# Patient Record
Sex: Female | Born: 1950 | Race: White | Hispanic: No | Marital: Married
Health system: Southern US, Community
[De-identification: ages and names within clinical notes are randomized; demographics above are authoritative.]

## PROBLEM LIST (undated history)

## (undated) HISTORY — PX: OTHER SURGICAL HISTORY: SHX169

---

## 2017-03-12 DIAGNOSIS — H5203 Hypermetropia, bilateral: Secondary | ICD-10-CM | POA: Diagnosis not present

## 2017-03-12 DIAGNOSIS — H52223 Regular astigmatism, bilateral: Secondary | ICD-10-CM | POA: Diagnosis not present

## 2017-03-12 DIAGNOSIS — H2513 Age-related nuclear cataract, bilateral: Secondary | ICD-10-CM | POA: Diagnosis not present

## 2017-03-12 DIAGNOSIS — H524 Presbyopia: Secondary | ICD-10-CM | POA: Diagnosis not present

## 2018-04-23 ENCOUNTER — Other Ambulatory Visit: Payer: Self-pay

## 2018-04-23 ENCOUNTER — Emergency Department (HOSPITAL_BASED_OUTPATIENT_CLINIC_OR_DEPARTMENT_OTHER)
Admission: EM | Admit: 2018-04-23 | Discharge: 2018-04-23 | Disposition: A | Discharge status: Home / Self Care | Payer: PPO | Attending: Emergency Medicine | Admitting: Emergency Medicine

## 2018-04-23 ENCOUNTER — Encounter (HOSPITAL_BASED_OUTPATIENT_CLINIC_OR_DEPARTMENT_OTHER): Payer: Self-pay | Admitting: Emergency Medicine

## 2018-04-23 ENCOUNTER — Emergency Department (HOSPITAL_BASED_OUTPATIENT_CLINIC_OR_DEPARTMENT_OTHER): Payer: PPO

## 2018-04-23 DIAGNOSIS — M545 Low back pain, unspecified: Secondary | ICD-10-CM

## 2018-04-23 DIAGNOSIS — Z87891 Personal history of nicotine dependence: Secondary | ICD-10-CM | POA: Insufficient documentation

## 2018-04-23 DIAGNOSIS — R1032 Left lower quadrant pain: Secondary | ICD-10-CM | POA: Diagnosis present

## 2018-04-23 LAB — COMPREHENSIVE METABOLIC PANEL
ALT: 18 U/L (ref 0–44)
AST: 17 U/L (ref 15–41)
Albumin: 4.4 g/dL (ref 3.5–5.0)
Alkaline Phosphatase: 69 U/L (ref 38–126)
Anion gap: 6 (ref 5–15)
BUN: 20 mg/dL (ref 8–23)
CO2: 25 mmol/L (ref 22–32)
Calcium: 9.3 mg/dL (ref 8.9–10.3)
Chloride: 108 mmol/L (ref 98–111)
Creatinine, Ser: 0.83 mg/dL (ref 0.44–1.00)
GFR calc non Af Amer: >60 mL/min (ref >60)
Glucose, Bld: 103 mg/dL — ABNORMAL HIGH (ref 70–99)
Potassium: 3.7 mmol/L (ref 3.5–5.1)
Sodium: 139 mmol/L (ref 135–145)
Total Bilirubin: 0.4 mg/dL (ref 0.3–1.2)
Total Protein: 7.3 g/dL (ref 6.5–8.1)

## 2018-04-23 LAB — URINALYSIS, MICROSCOPIC (REFLEX)

## 2018-04-23 LAB — URINALYSIS, ROUTINE W REFLEX MICROSCOPIC
Bilirubin Urine: NEGATIVE
Glucose, UA: NEGATIVE mg/dL
Hgb urine dipstick: NEGATIVE
Ketones, ur: NEGATIVE mg/dL
Nitrite: NEGATIVE
Protein, ur: NEGATIVE mg/dL
Specific Gravity, Urine: 1.02 (ref 1.005–1.030)
pH: 6.5 (ref 5.0–8.0)

## 2018-04-23 LAB — CBC WITH DIFFERENTIAL/PLATELET
Abs Immature Granulocytes: 0.02 10*3/uL (ref 0.00–0.07)
BASOS PCT: 0 %
Basophils Absolute: 0 10*3/uL (ref 0.0–0.1)
EOS ABS: 0.2 10*3/uL (ref 0.0–0.5)
Eosinophils Relative: 2 %
HCT: 39.9 % (ref 36.0–46.0)
Hemoglobin: 12.5 g/dL (ref 12.0–15.0)
Immature Granulocytes: 0 %
Lymphocytes Relative: 27 %
Lymphs Abs: 2.5 10*3/uL (ref 0.7–4.0)
MCH: 29.9 pg (ref 26.0–34.0)
MCHC: 31.3 g/dL (ref 30.0–36.0)
MCV: 95.5 fL (ref 80.0–100.0)
Monocytes Absolute: 0.7 10*3/uL (ref 0.1–1.0)
Monocytes Relative: 7 %
Neutro Abs: 5.6 10*3/uL (ref 1.7–7.7)
Neutrophils Relative %: 64 %
PLATELETS: 303 10*3/uL (ref 150–400)
RBC: 4.18 MIL/uL (ref 3.87–5.11)
RDW: 13.2 % (ref 11.5–15.5)
WBC: 9.1 10*3/uL (ref 4.0–10.5)
nRBC: 0 % (ref 0.0–0.2)

## 2018-04-23 LAB — LIPASE, BLOOD: Lipase: 37 U/L (ref 11–51)

## 2018-04-23 MED ORDER — DIAZEPAM 5 MG/ML IJ SOLN
5.0000 mg | Freq: Once | INTRAMUSCULAR | Status: AC
  Administered 2018-04-23: 5 mg via INTRAVENOUS
  Filled 2018-04-23: qty 2

## 2018-04-23 MED ORDER — KETOROLAC TROMETHAMINE 30 MG/ML IJ SOLN
15.0000 mg | Freq: Once | INTRAMUSCULAR | Status: AC
  Administered 2018-04-23: 15 mg via INTRAVENOUS
  Filled 2018-04-23: qty 1

## 2018-04-23 MED ORDER — HYDROCODONE-ACETAMINOPHEN 5-325 MG PO TABS: 1.0000 | ORAL_TABLET | Freq: Four times a day (QID) | ORAL | 0 refills | Status: AC | PRN

## 2018-04-23 MED ORDER — ONDANSETRON HCL 4 MG/2ML IJ SOLN
4.0000 mg | Freq: Once | INTRAMUSCULAR | Status: AC
  Administered 2018-04-23: 4 mg via INTRAVENOUS
  Filled 2018-04-23: qty 2

## 2018-04-23 MED ORDER — MORPHINE SULFATE (PF) 4 MG/ML IV SOLN
4.0000 mg | Freq: Once | INTRAVENOUS | Status: AC
  Administered 2018-04-23: 4 mg via INTRAVENOUS
  Filled 2018-04-23: qty 1

## 2018-04-23 NOTE — ED Notes (Signed)
ED Provider at bedside. 

## 2018-04-23 NOTE — ED Provider Notes (Signed)
[facility] EMERGENCY DEPARTMENT Provider Note   CSN: [id number] Arrival date & time: 04/23/18  1640    History   Chief Complaint Chief Complaint  Patient presents with  . Back Pain    HPI Kristy Jordan is a 68 y.o. female who presents for evaluation of left-sided back pain that has been ongoing for last 5 days.  She reports the pain initially began about 5 days ago.  She reports that it stopped and then she was feeling better.  She reports about 2 days ago, her pain returned.  She reports it is on the left side.  She states it is worse when she tries to move or walk.  She denies any preceding trauma, injury, fall.  She reports taking over-the-counter Advil with minimal improvement in symptoms.  She reports her husband had pain medication left over and she tried that.  She reports it eased the pain but then it returned.  She reports that today's she started developing some crampy lower abdominal pain, prompting ED visit.  Patient states that she did note does not have any history of back issues.  Patient reports that she has been able to ambulate but does report that it feels worse with ambulation.  She reports that the pain does not radiate down the posterior aspect of her leg.  Patient denies any fevers, chest pain, difficulty breathing, nausea/vomiting, urinary or bowel incontinence, saddle anesthesia, numbness/weakness of arms or legs.     The history is provided by the patient.    History reviewed. No pertinent past medical history.  There are no active problems to display for this patient.   Past Surgical History:  Procedure Laterality Date  . thyroid       OB History   No obstetric history on file.      Home Medications    Prior to Admission medications   Medication Sig Start Date End Date Taking? Authorizing Provider  HYDROcodone-acetaminophen (NORCO/VICODIN) 5-325 MG tablet Take 1-2 tablets by mouth every 6 (six) hours as needed. 04/23/18   Maxwell Caul, PA-C    Family History History reviewed. No pertinent family history.  Social History Social History   Tobacco Use  . Smoking status: Former Games developer  . Smokeless tobacco: Never Used  Substance Use Topics  . Alcohol use: Yes    Frequency: Never  . Drug use: Never     Allergies   Patient has no known allergies.   Review of Systems Review of Systems  Constitutional: Negative for fever.  Respiratory: Negative for cough and shortness of breath.   Cardiovascular: Negative for chest pain.  Gastrointestinal: Positive for abdominal pain. Negative for nausea and vomiting.  Genitourinary: Negative for dysuria and hematuria.  Musculoskeletal: Positive for back pain.  Neurological: Negative for headaches.  All other systems reviewed and are negative.    Physical Exam Updated Vital Signs BP (!) 126/59 (BP Location: Left Arm)   Pulse 78   Temp 97.6 F (36.4 C) (Oral)   Resp 16   Ht 5' (1.524 m)   Wt 72.6 kg   SpO2 97%   BMI 31.25 kg/m   Physical Exam Vitals signs and nursing note reviewed.  Constitutional:      Appearance: Normal appearance. She is well-developed.  HENT:     Head: Normocephalic and atraumatic.  Eyes:     General: Lids are normal.     Conjunctiva/sclera: Conjunctivae normal.     Pupils: Pupils are equal, round, and reactive  to light.  Neck:     Musculoskeletal: Full passive range of motion without pain.     Comments: Full flexion/extension and lateral movement of neck fully intact. No bony midline tenderness. No deformities or crepitus.  Cardiovascular:     Rate and Rhythm: Normal rate and regular rhythm.     Pulses: Normal pulses.          Radial pulses are 2+ on the right side and 2+ on the left side.       Dorsalis pedis pulses are 2+ on the right side and 2+ on the left side.     Heart sounds: Normal heart sounds. No murmur. No friction rub. No gallop.   Pulmonary:     Effort: Pulmonary effort is normal.     Breath sounds: Normal breath  sounds.  Abdominal:     Palpations: Abdomen is soft. Abdomen is not rigid.     Tenderness: There is no abdominal tenderness. There is left CVA tenderness. There is no guarding.     Comments: Abdomen is soft, non-distended, non-tender. No rigidity, No guarding. No peritoneal signs.  Left-sided CVA tenderness.  Musculoskeletal: Normal range of motion.     Thoracic back: She exhibits no tenderness.     Lumbar back: She exhibits no tenderness.       Back:     Comments: No midline tenderness noted to the thoracic or lumbar region. Diffuse tenderness noted to the paraspinal muscles of the left lumbar region.  Skin:    General: Skin is warm and dry.     Capillary Refill: Capillary refill takes less than 2 seconds.  Neurological:     Mental Status: She is alert and oriented to person, place, and time.  Psychiatric:        Speech: Speech normal.      ED Treatments / Results  Labs (all labs ordered are listed, but only abnormal results are displayed) Labs Reviewed  URINALYSIS, ROUTINE W REFLEX MICROSCOPIC - Abnormal; Notable for the following components:      Result Value   Leukocytes,Ua TRACE (*)    All other components within normal limits  COMPREHENSIVE METABOLIC PANEL - Abnormal; Notable for the following components:   Glucose, Bld 103 (*)    All other components within normal limits  URINALYSIS, MICROSCOPIC (REFLEX) - Abnormal; Notable for the following components:   Bacteria, UA RARE (*)    All other components within normal limits  CBC WITH DIFFERENTIAL/PLATELET  LIPASE, BLOOD    EKG None  Radiology Ct Renal Stone Study  Result Date: 04/23/2018 CLINICAL DATA:  Low back pain EXAM: CT ABDOMEN AND PELVIS WITHOUT CONTRAST TECHNIQUE: Multidetector CT imaging of the abdomen and pelvis was performed following the standard protocol without IV contrast. COMPARISON:  None. FINDINGS: Lower chest: Lung bases are clear. No effusions. Heart is normal size. Hepatobiliary: No focal hepatic  abnormality. Gallbladder unremarkable. Pancreas: No focal abnormality or ductal dilatation. Spleen: No focal abnormality.  Normal size. Adrenals/Urinary Tract: No adrenal abnormality. No focal renal abnormality. No stones or hydronephrosis. Urinary bladder is unremarkable. Stomach/Bowel: Moderate stool burden throughout the colon. Stomach, large and small bowel grossly unremarkable. Vascular/Lymphatic: Aortic atherosclerosis. No enlarged abdominal or pelvic lymph nodes. Reproductive: Uterus and adnexa unremarkable. No mass. Bilateral tubal ligation clips noted. Other: No free fluid or free air. Musculoskeletal: No acute bony abnormality. IMPRESSION: No acute findings in the abdomen or pelvis. Moderate stool burden in the colon. Aortic atherosclerosis. Electronically Signed   By: Charlett Nose  M.D.   On: 04/23/2018 21:16    Procedures Procedures (including critical care time)  Medications Ordered in ED Medications  ondansetron (ZOFRAN) injection 4 mg (4 mg Intravenous Given 04/23/18 1927)  morphine 4 MG/ML injection 4 mg (4 mg Intravenous Given 04/23/18 1927)  ketorolac (TORADOL) 30 MG/ML injection 15 mg (15 mg Intravenous Given 04/23/18 2106)  diazepam (VALIUM) injection 5 mg (5 mg Intravenous Given 04/23/18 2106)     Initial Impression / Assessment and Plan / ED Course  I have reviewed the triage vital signs and the nursing notes.  Pertinent labs & imaging results that were available during my care of the patient were reviewed by me and considered in my medical decision making (see chart for details).        68 year old female who presents for evaluation of left-sided back pain.  Initially start about 5 days ago and then improved.  She reports that since 2 days ago, the pain has returned and been more severe.  Now with some crampy abdominal pain.  No fevers, urinary complaints. Patient is afebrile, non-toxic appearing, sitting comfortably on examination table. Vital signs reviewed and stable.   Exam, patient has some questionable CVA tenderness on the left side/paraspinal muscle tenderness of the left side.  Consider kidney stone versus GU etiology versus musculoskeletal back pain.  History/physical exam not concerning for aortic dissection.  History/physical exam not concerning for cauda equina, spinal abscess.  Will plan for labs, renal study.  Lipase unremarkable.  CBC without any significant leukocytosis or anemia.  CMP is unremarkable.  UA negative for any infectious etiology or hemoglobin.  CT renal study negative for any acute abnormalities.  There is moderate stool burden noted in the colon.  Question small left sided kidney stone. She does not have any evidence of hemoglobin on her UA.  Discussed results with patient. Question msk pain vs small kidney stone.  Patient reports improvement in pain since being here in the ED.  She is able to ambulate. We will plan to give a short course of pain medication help with pain.  Encourage at home supportive care measures. At this time, patient exhibits no emergent life-threatening condition that require further evaluation in ED or admission. Patient had ample opportunity for questions and discussion. All patient's questions were answered with full understanding. Strict return precautions discussed. Patient expresses understanding and agreement to plan.   Portions of this note were generated with Scientist, clinical (histocompatibility and immunogenetics). Dictation errors may occur despite best attempts at proofreading.   Final Clinical Impressions(s) / ED Diagnoses   Final diagnoses:  Acute left-sided low back pain, unspecified whether sciatica present    ED Discharge Orders         Ordered    HYDROcodone-acetaminophen (NORCO/VICODIN) 5-325 MG tablet  Every 6 hours PRN     04/23/18 2203           Maxwell Caul, PA-C 04/24/18 0131    Tegeler, Canary Brim, MD 04/24/18 1310

## 2018-04-23 NOTE — ED Notes (Signed)
Patient transported to CT 

## 2018-04-23 NOTE — ED Triage Notes (Signed)
patinet states that she is having pain to her lower back that hurts worse with movement and walking  - the patient reports that she has had this pain for the last 2 -3 days

## 2018-04-23 NOTE — Discharge Instructions (Addendum)
You can take 1000 mg of Tylenol.  Do not exceed 4000 mg of Tylenol a day.  Take pain medications as directed for break through pain. Do not drive or operate machinery while taking this medication.   Please follow-up with your primary care doctor this week.  Return the emergency department for any worsening pain, vomiting, fevers, difficulty breathing or any other worsening or concerning symptoms.

## 2019-02-28 DIAGNOSIS — R05 Cough: Secondary | ICD-10-CM | POA: Diagnosis not present

## 2019-02-28 DIAGNOSIS — Z20828 Contact with and (suspected) exposure to other viral communicable diseases: Secondary | ICD-10-CM | POA: Diagnosis not present

## 2020-02-04 IMAGING — CT CT RENAL STONE PROTOCOL
2 of 4 series · 17 of 46 positions shown, 19 images · non-contrast
Comparison: None.

CLINICAL DATA: Low back pain

EXAM:
CT ABDOMEN AND PELVIS WITHOUT CONTRAST
TECHNIQUE: Multidetector CT imaging of the abdomen and pelvis was performed
following the standard protocol without IV contrast.

[Series 2: axial st · axial · 0.71mm/px · z∈[+638,+1013]mm · 14 of 83 slices shown, 16 images]
[im 4/83  soft-tissue]
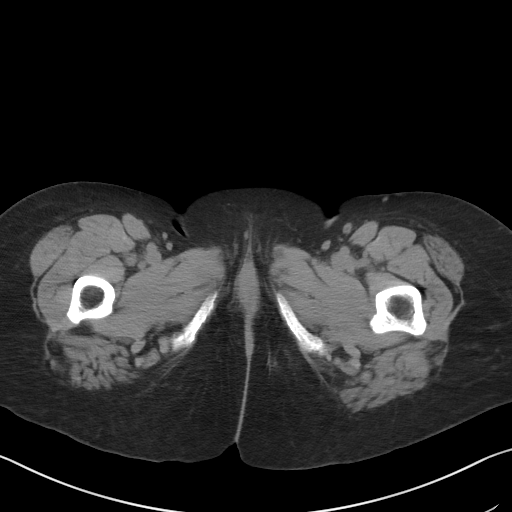
[im 4/83  bone]
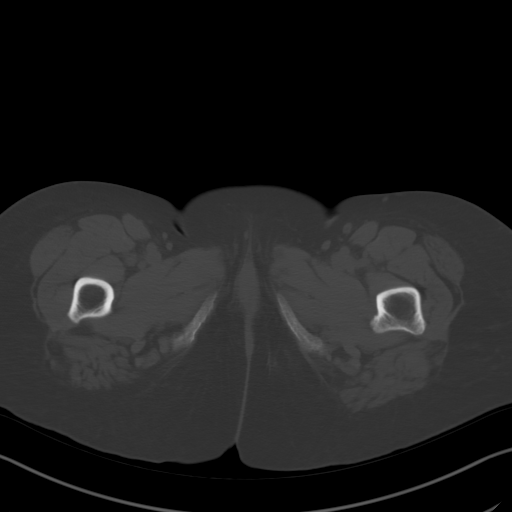
[im 10/83  soft-tissue]
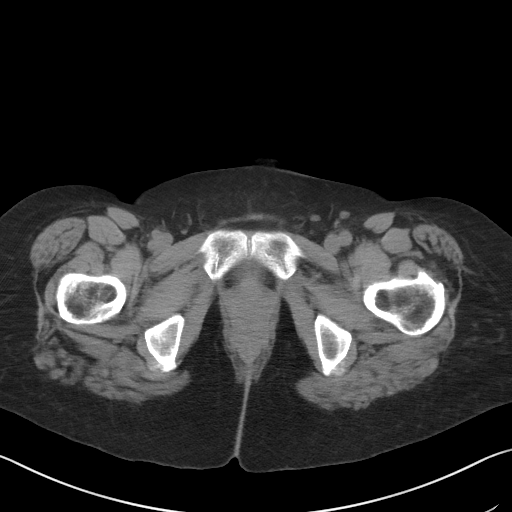
[im 16/83  soft-tissue]
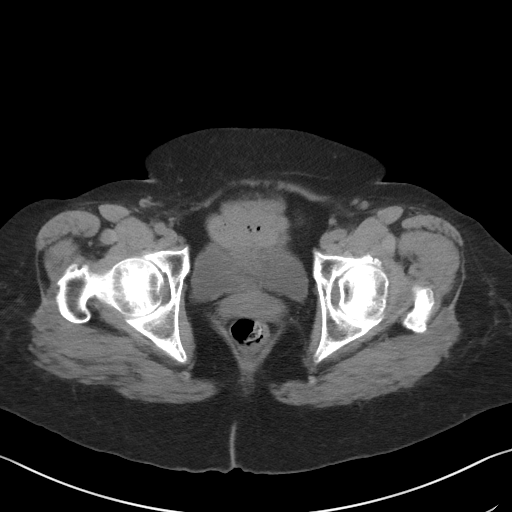
[im 23/83  soft-tissue]
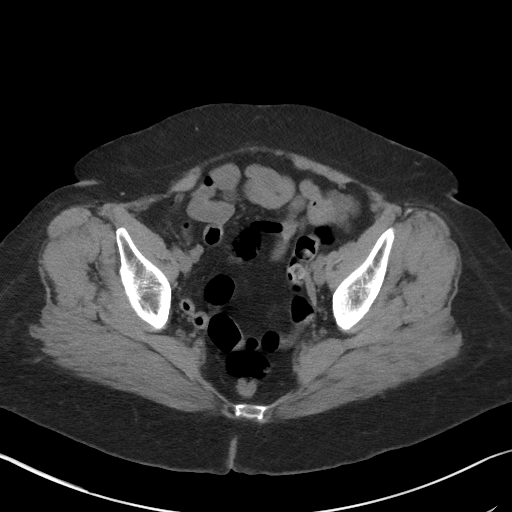
[im 29/83  soft-tissue]
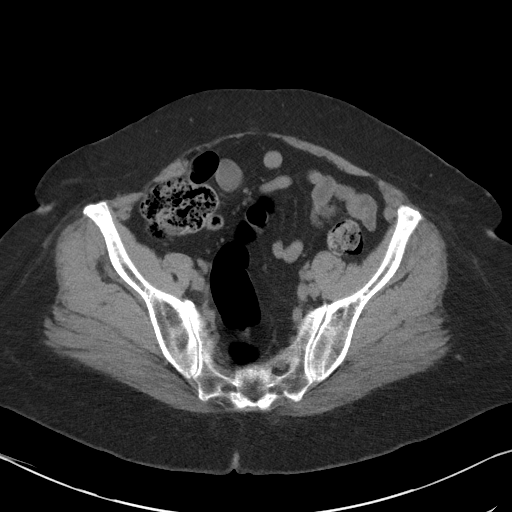
[im 32/83  soft-tissue]
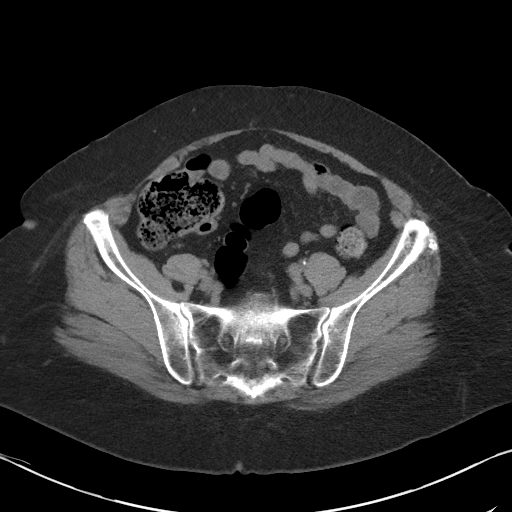
[im 38/83  soft-tissue]
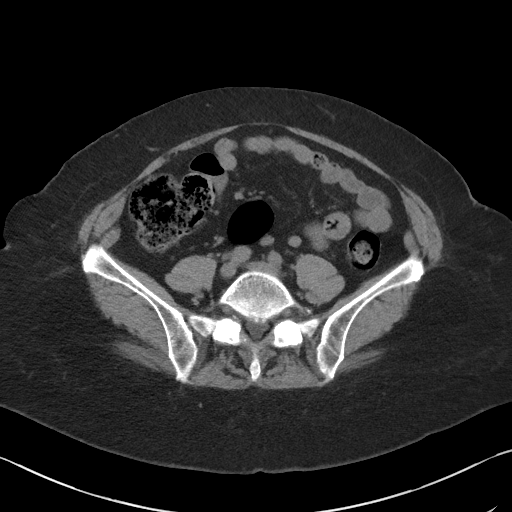
[im 45/83  soft-tissue]
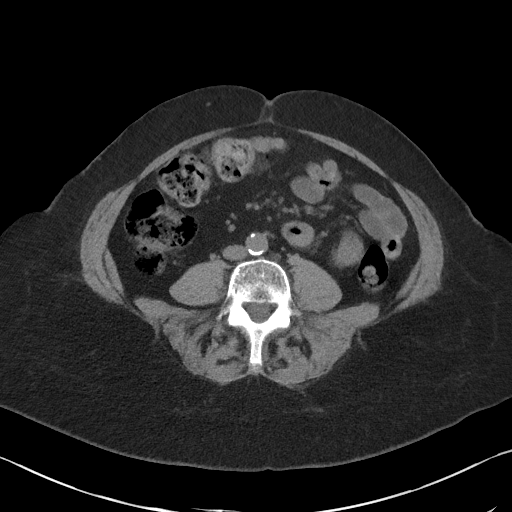
[im 51/83  soft-tissue]
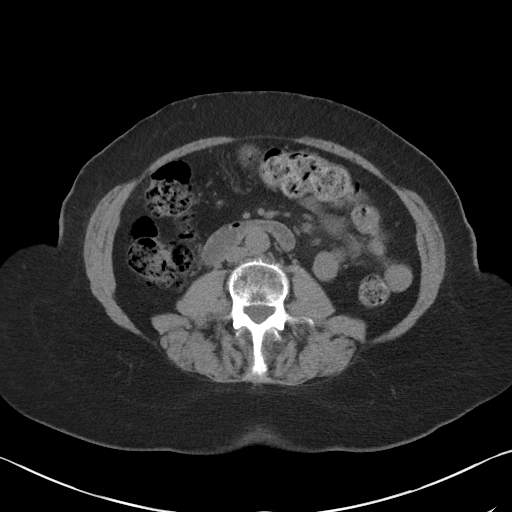
[im 51/83  bone]
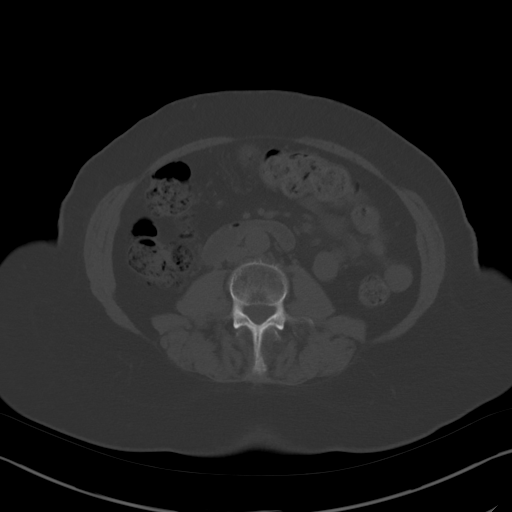
[im 54/83  soft-tissue]
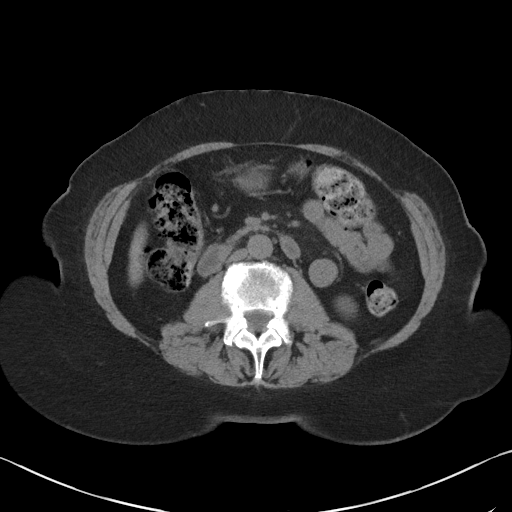
[im 60/83  soft-tissue]
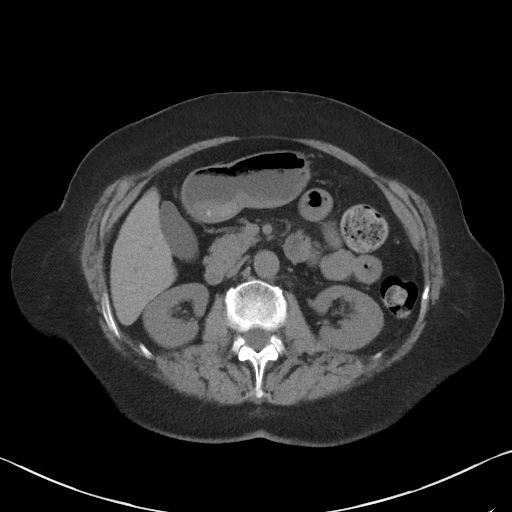
[im 67/83  soft-tissue]
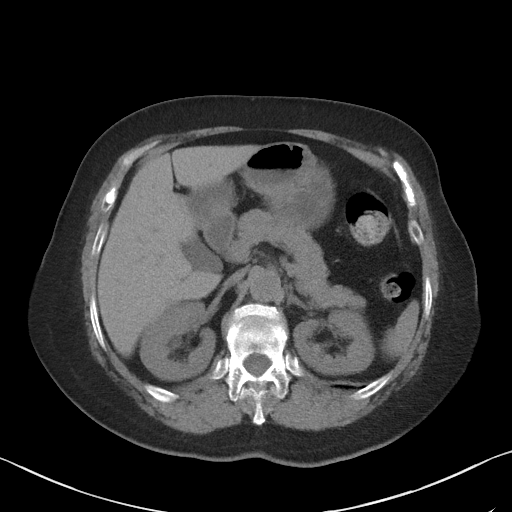
[im 73/83  soft-tissue]
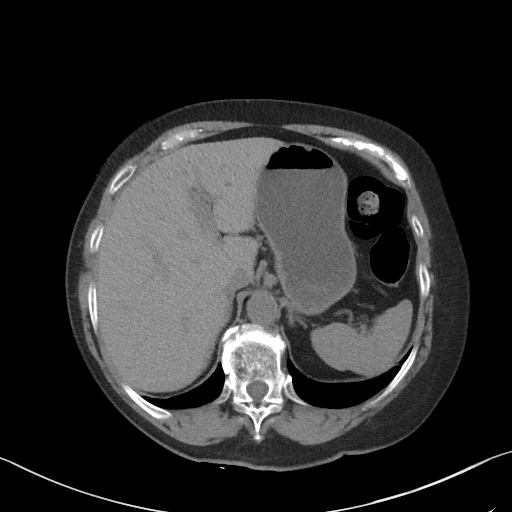
[im 79/83  soft-tissue]
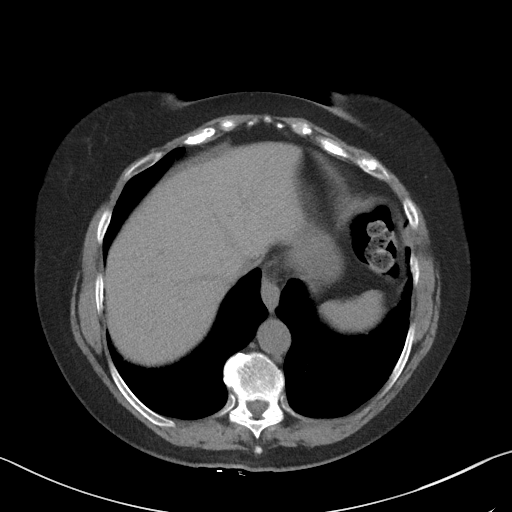

[Series 4: coronal st · coronal · 0.71mm/px · 3 of 97 slices shown]
[im 33/97  soft-tissue]
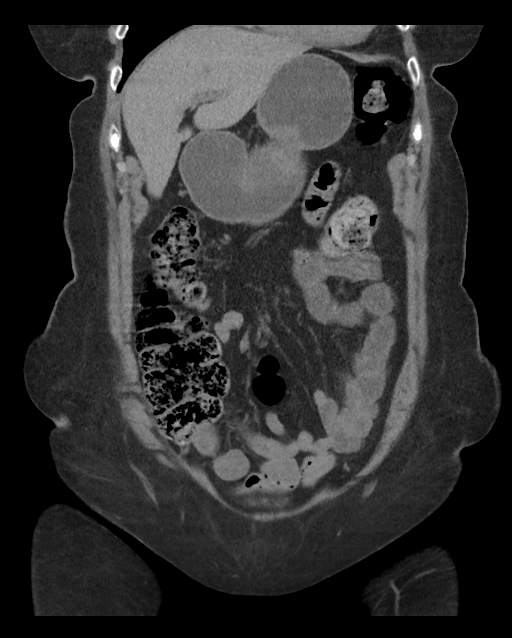
[im 43/97  soft-tissue]
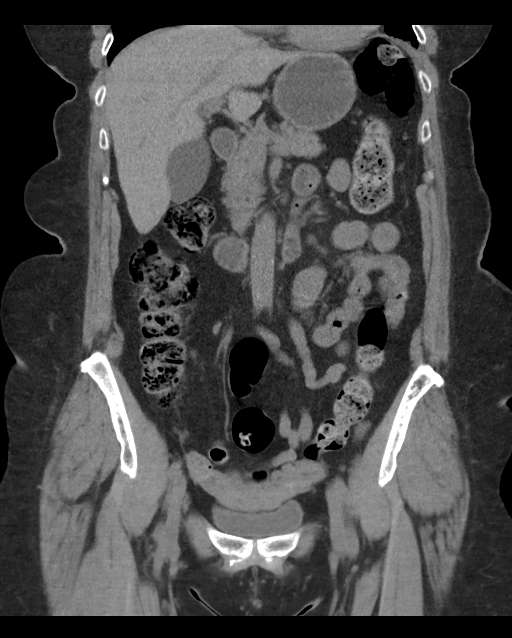
[im 54/97  soft-tissue]
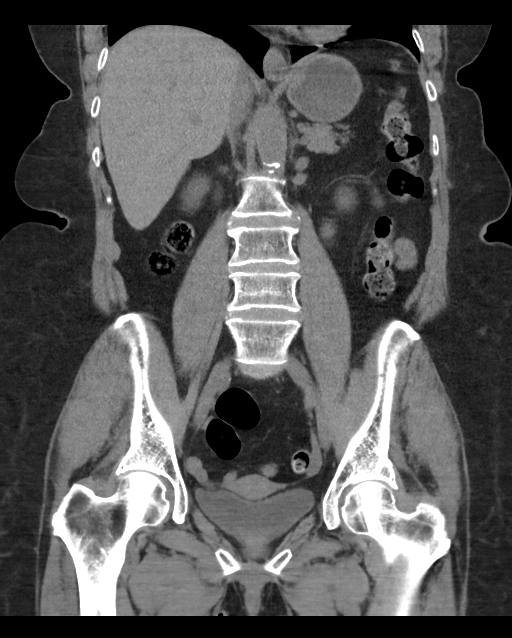

[17 of 46 positions shown; findings below may reference images not displayed]

FINDINGS: Lower chest: Lung bases are clear. No effusions. Heart is normal
size.

Hepatobiliary: No focal hepatic abnormality. Gallbladder
unremarkable.

Pancreas: No focal abnormality or ductal dilatation.

Spleen: No focal abnormality.  Normal size.

Adrenals/Urinary Tract: No adrenal abnormality. No focal renal
abnormality. No stones or hydronephrosis. Urinary bladder is
unremarkable.

Stomach/Bowel: Moderate stool burden throughout the colon. Stomach,
large and small bowel grossly unremarkable.

Vascular/Lymphatic: Aortic atherosclerosis. No enlarged abdominal or
pelvic lymph nodes.

Reproductive: Uterus and adnexa unremarkable. No mass. Bilateral
tubal ligation clips noted.

Other: No free fluid or free air.

Musculoskeletal: No acute bony abnormality.
IMPRESSION: No acute findings in the abdomen or pelvis.

Moderate stool burden in the colon.

Aortic atherosclerosis.

## 2020-10-18 DIAGNOSIS — U071 COVID-19: Secondary | ICD-10-CM | POA: Diagnosis not present

## 2020-10-18 DIAGNOSIS — Z20822 Contact with and (suspected) exposure to covid-19: Secondary | ICD-10-CM | POA: Diagnosis not present

## 2020-10-25 DIAGNOSIS — U071 COVID-19: Secondary | ICD-10-CM | POA: Diagnosis not present
